# Patient Record
Sex: Male | Born: 1955 | Hispanic: No | Marital: Married
Health system: Southern US, Community
[De-identification: ages and names within clinical notes are randomized; demographics above are authoritative.]

---

## 2005-01-07 ENCOUNTER — Encounter: Admission: RE | Admit: 2005-01-07 | Discharge: 2005-01-07 | Payer: Self-pay | Admitting: Specialist

## 2005-01-08 ENCOUNTER — Ambulatory Visit (HOSPITAL_COMMUNITY): Admission: RE | Admit: 2005-01-08 | Discharge: 2005-01-09 | Payer: Self-pay | Admitting: Specialist

## 2005-05-22 ENCOUNTER — Emergency Department: Payer: Self-pay | Admitting: Internal Medicine

## 2005-05-22 ENCOUNTER — Other Ambulatory Visit: Payer: Self-pay

## 2005-10-23 ENCOUNTER — Inpatient Hospital Stay: Payer: Self-pay | Admitting: Internal Medicine

## 2006-05-31 ENCOUNTER — Ambulatory Visit: Payer: Self-pay | Admitting: Family Medicine

## 2007-01-03 ENCOUNTER — Inpatient Hospital Stay: Payer: Self-pay | Admitting: Internal Medicine

## 2007-01-03 ENCOUNTER — Other Ambulatory Visit: Payer: Self-pay

## 2007-02-21 ENCOUNTER — Emergency Department (HOSPITAL_COMMUNITY): Admission: EM | Admit: 2007-02-21 | Discharge: 2007-02-21 | Payer: Self-pay | Admitting: Emergency Medicine

## 2008-05-03 ENCOUNTER — Emergency Department: Payer: Self-pay | Admitting: Emergency Medicine

## 2008-11-16 ENCOUNTER — Inpatient Hospital Stay: Payer: Self-pay | Admitting: Internal Medicine

## 2008-11-23 ENCOUNTER — Ambulatory Visit: Payer: Self-pay | Admitting: Internal Medicine

## 2008-12-10 ENCOUNTER — Inpatient Hospital Stay: Payer: Self-pay | Admitting: Specialist

## 2009-08-07 ENCOUNTER — Ambulatory Visit: Payer: Self-pay

## 2010-10-31 NOTE — Op Note (Signed)
Leonard Wallace, Leonard Wallace                ACCOUNT NO.:  [id number]   MEDICAL RECORD NO.:  [id number]          PATIENT TYPE:  AMB   LOCATION:  DAY                          FACILITY:  [facility]   PHYSICIAN:  Jene Every, M.D.    DATE OF BIRTH:  1956/05/20   DATE OF PROCEDURE:  01/08/2005  DATE OF DISCHARGE:                                 OPERATIVE REPORT   PREOPERATIVE DIAGNOSES:  Spinal stenosis at L4-5.   POSTOPERATIVE DIAGNOSES:  Spinal stenosis at L4-5.   PROCEDURE:  1.  Insertion of X Stop device at the L4-5 interspace.  2.  Partial excision of posterior element of the spinous process.   ANESTHESIA:  General.   ASSISTANT:  Wende Neighbors, P.A.   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 Recently noted to have severe  stenosis at 4-5, multifactorial with facet arthropathy, old focal disk  bulging, ligamentum flavum, hypertrophy. The patient demonstrated neurogenic  claudication with radicular symptoms left greater than right with standing  and relieved with flexion such as leaning forward on a table sitting down  also sitting down and leaning forward. The patient had been refractory to  conservative treatment including epidural steroid injections, activity  modifications, weight reduction, the patient was fairly obese. We had the  options of standard central decompression which with his size and congenital  stenosis, we felt that he would require multiple procedures including repeat  decompression, fusion, etc. Given the added relief in flexion, the option  was the X Stop device which holds the interspinous processes open to prevent  ligamentum flavum and facet compression on extension. We discussed the risks  and benefits of that procedure including bleeding, infection, damage to  vascular structures, no change in symptoms, worsening of symptoms, need for  removal of device and the standard laminectomy  approach. We felt that the  patient for his ability to return to work, if the X Stop was successful in  helping his symptoms, this will be a favorable situation for him. He did  understand, however, that he very well may require removal and eventual  decompression.   TECHNIQUE:  The patient in supine position and after the induction of  adequate general anesthesia and 500 mg of IV vancomycin, he was placed prone  on the Wilson frame in slight flexion. All bony prominences well padded, he  was secured. He is a fairly large size. The lumbar region was prepped and  draped in the usual sterile fashion. I made an incision based upon needle  localization over the spinous process of 4 with some difficulty in  visualizing the posterior elements due to the patient's size. The  subcutaneous tissue was dissected, electrocautery was utilized to achieve  hemostasis. The dorsolumbar fascia identified and divided in line with the  skin incision, paraspinous muscle elevated from the lamina of 4 and 5.  McCullough retractor was placed under x-ray. We identified the spinous  process of 4 and 5 and the interspinous process space. I then used the X  Stop dilators to  dilate beneath the interspinous ligament. I then placed the  tensioning device and there was no tension with this patient and moderate  flexion at 10, slightly at 12 and we felt adequate tension at 14 and over  tensioning at 16. I therefore felt a 14 given his size and the spacing was  appropriate. Some small areas posterior on the element on the undersurface  of the spinous process and removed a small portion of the bone with a 3 mm  Kerrison. And a small area off the facet. The device was then inserted  without difficulty. The wing was then attached, this was placed beneath the  interspinous ligament and confirmed on x-ray to be satisfactory. The wing  was attached on the contralateral side, clamped for compression and the set  screw tightened  and ratcheted x2 which is recommended by the surgical  technique. Reviewed this on the AP and lateral plane to be satisfactory  placement. It was felt that if we did get some distraction of the  interspinous ligament that was appropriate. I then copiously irrigated the  wound. It was felt that this was appropriate. Removed the McCullough  retractors, electrocautery utilized to achieve strict hemostasis.  Dorsolumbar fascia reapproximated with #1 Vicryl and interrupted figure-of-  eight suture. After the paraspinous muscles were examined with no evidence  of active bleeding, the subcutaneous tissue was approximated in multiple  layers with 0 and 2-0 Vicryl simple sutures, the skin was reapproximated  with staples. I did place a Penrose drain and brought it out between the  staples, covered with Adaptic 4 x 4's and ABD's and a sterile dressing. We  examined it laterally after relieving some of the flexion, it appeared to be  satisfactory. We then placed him back on the hospital bed, extubated him  without difficulty and transported him to the recovery room in satisfactory  condition.   The patient tolerated the procedure well with no complications.       JB/MEDQ  D:  01/08/2005  T:  01/08/2005  Job:  [id number]

## 2011-02-17 ENCOUNTER — Encounter: Payer: Self-pay | Admitting: Family Medicine

## 2011-09-29 ENCOUNTER — Inpatient Hospital Stay: Payer: Self-pay | Admitting: Internal Medicine

## 2011-09-29 DIAGNOSIS — R55 Syncope and collapse: Secondary | ICD-10-CM

## 2011-09-29 LAB — COMPREHENSIVE METABOLIC PANEL
Alkaline Phosphatase: 73 U/L (ref 50–136)
Anion Gap: 10 (ref 7–16)
Bilirubin,Total: 0.8 mg/dL (ref 0.2–1.0)
Calcium, Total: 8.7 mg/dL (ref 8.5–10.1)
Chloride: 104 mmol/L (ref 98–107)
Co2: 29 mmol/L (ref 21–32)
EGFR (African American): >60
EGFR (Non-African Amer.): >60
Glucose: 120 mg/dL — ABNORMAL HIGH (ref 65–99)
Osmolality: 288 (ref 275–301)
Potassium: 4.2 mmol/L (ref 3.5–5.1)
SGOT(AST): 54 U/L — ABNORMAL HIGH (ref 15–37)
SGPT (ALT): 46 U/L
Total Protein: 7.4 g/dL (ref 6.4–8.2)

## 2011-09-29 LAB — CK TOTAL AND CKMB (NOT AT ARMC)
CK, Total: 468 U/L — ABNORMAL HIGH (ref 35–232)
CK, Total: 245 U/L — ABNORMAL HIGH (ref 35–232)
CK-MB: 7.3 ng/mL — ABNORMAL HIGH (ref 0.5–3.6)

## 2011-09-29 LAB — APTT: Activated PTT: 33.3 secs (ref 23.6–35.9)

## 2011-09-29 LAB — CBC
HCT: 34.3 % — ABNORMAL LOW (ref 40.0–52.0)
HGB: 11.2 g/dL — ABNORMAL LOW (ref 13.0–18.0)
MCH: 26.7 pg (ref 26.0–34.0)
MCV: 82 fL (ref 80–100)
RBC: 4.19 10*6/uL — ABNORMAL LOW (ref 4.40–5.90)

## 2011-09-29 LAB — TROPONIN I: Troponin-I: <0.02 ng/mL

## 2011-09-29 LAB — PROTIME-INR: INR: 1.6

## 2011-09-30 DIAGNOSIS — I059 Rheumatic mitral valve disease, unspecified: Secondary | ICD-10-CM

## 2011-09-30 LAB — LIPID PANEL
Cholesterol: 157 mg/dL (ref 0–200)
Ldl Cholesterol, Calc: 112 mg/dL — ABNORMAL HIGH (ref 0–100)
Triglycerides: 111 mg/dL (ref 0–200)

## 2011-09-30 LAB — DRUG SCREEN, URINE
Amphetamines, Ur Screen: NEGATIVE (ref ?–1000)
Cannabinoid 50 Ng, Ur ~~LOC~~: NEGATIVE (ref ?–50)
MDMA (Ecstasy)Ur Screen: NEGATIVE (ref ?–500)
Methadone, Ur Screen: NEGATIVE (ref ?–300)
Opiate, Ur Screen: POSITIVE (ref ?–300)
Phencyclidine (PCP) Ur S: NEGATIVE (ref ?–25)
Tricyclic, Ur Screen: NEGATIVE (ref ?–1000)

## 2011-09-30 LAB — CBC WITH DIFFERENTIAL/PLATELET
Basophil #: 0 10*3/uL (ref 0.0–0.1)
Eosinophil %: 4 %
HCT: 31.2 % — ABNORMAL LOW (ref 40.0–52.0)
Monocyte %: 5.7 %
RDW: 17.4 % — ABNORMAL HIGH (ref 11.5–14.5)

## 2011-09-30 LAB — CK TOTAL AND CKMB (NOT AT ARMC): CK, Total: 161 U/L (ref 35–232)

## 2011-09-30 LAB — BASIC METABOLIC PANEL
Anion Gap: 5 — ABNORMAL LOW (ref 7–16)
BUN: 17 mg/dL (ref 7–18)
Calcium, Total: 8.4 mg/dL — ABNORMAL LOW (ref 8.5–10.1)
Creatinine: 0.85 mg/dL (ref 0.60–1.30)
EGFR (Non-African Amer.): >60
Glucose: 88 mg/dL (ref 65–99)
Sodium: 142 mmol/L (ref 136–145)

## 2011-09-30 LAB — TROPONIN I: Troponin-I: <0.02 ng/mL

## 2011-09-30 LAB — MAGNESIUM: Magnesium: 1.4 mg/dL — ABNORMAL LOW

## 2011-10-01 LAB — PROTIME-INR
INR: 1.3
Prothrombin Time: 16.8 secs — ABNORMAL HIGH (ref 11.5–14.7)

## 2011-11-04 ENCOUNTER — Encounter (HOSPITAL_COMMUNITY): Payer: Self-pay | Admitting: Radiology

## 2011-11-04 ENCOUNTER — Emergency Department (HOSPITAL_COMMUNITY): Payer: PRIVATE HEALTH INSURANCE

## 2011-11-04 ENCOUNTER — Inpatient Hospital Stay (HOSPITAL_COMMUNITY): Payer: PRIVATE HEALTH INSURANCE

## 2011-11-04 ENCOUNTER — Inpatient Hospital Stay (HOSPITAL_COMMUNITY)
Admission: EM | Admit: 2011-11-04 | Discharge: 2011-11-14 | DRG: 082 | Disposition: E | Payer: PRIVATE HEALTH INSURANCE | Source: Ambulatory Visit | Attending: General Surgery | Admitting: General Surgery

## 2011-11-04 DIAGNOSIS — S0190XA Unspecified open wound of unspecified part of head, initial encounter: Secondary | ICD-10-CM

## 2011-11-04 DIAGNOSIS — J9383 Other pneumothorax: Secondary | ICD-10-CM | POA: Diagnosis not present

## 2011-11-04 DIAGNOSIS — E871 Hypo-osmolality and hyponatremia: Secondary | ICD-10-CM | POA: Diagnosis present

## 2011-11-04 DIAGNOSIS — W3400XA Accidental discharge from unspecified firearms or gun, initial encounter: Secondary | ICD-10-CM

## 2011-11-04 DIAGNOSIS — S0100XA Unspecified open wound of scalp, initial encounter: Secondary | ICD-10-CM

## 2011-11-04 DIAGNOSIS — R000 Tachycardia, unspecified: Secondary | ICD-10-CM | POA: Diagnosis not present

## 2011-11-04 DIAGNOSIS — I6200 Nontraumatic subdural hemorrhage, unspecified: Secondary | ICD-10-CM

## 2011-11-04 DIAGNOSIS — S069X9A Unspecified intracranial injury with loss of consciousness of unspecified duration, initial encounter: Secondary | ICD-10-CM

## 2011-11-04 DIAGNOSIS — D62 Acute posthemorrhagic anemia: Secondary | ICD-10-CM | POA: Diagnosis present

## 2011-11-04 DIAGNOSIS — J90 Pleural effusion, not elsewhere classified: Secondary | ICD-10-CM | POA: Diagnosis not present

## 2011-11-04 DIAGNOSIS — G936 Cerebral edema: Secondary | ICD-10-CM | POA: Diagnosis present

## 2011-11-04 DIAGNOSIS — R578 Other shock: Secondary | ICD-10-CM | POA: Diagnosis present

## 2011-11-04 DIAGNOSIS — S065X9A Traumatic subdural hemorrhage with loss of consciousness of unspecified duration, initial encounter: Secondary | ICD-10-CM

## 2011-11-04 DIAGNOSIS — Y92009 Unspecified place in unspecified non-institutional (private) residence as the place of occurrence of the external cause: Secondary | ICD-10-CM

## 2011-11-04 DIAGNOSIS — X72XXXA Intentional self-harm by handgun discharge, initial encounter: Secondary | ICD-10-CM | POA: Diagnosis present

## 2011-11-04 DIAGNOSIS — T794XXA Traumatic shock, initial encounter: Secondary | ICD-10-CM

## 2011-11-04 DIAGNOSIS — D689 Coagulation defect, unspecified: Secondary | ICD-10-CM | POA: Diagnosis not present

## 2011-11-04 DIAGNOSIS — J95821 Acute postprocedural respiratory failure: Secondary | ICD-10-CM

## 2011-11-04 DIAGNOSIS — S020XXB Fracture of vault of skull, initial encounter for open fracture: Principal | ICD-10-CM | POA: Diagnosis present

## 2011-11-04 DIAGNOSIS — Z515 Encounter for palliative care: Secondary | ICD-10-CM

## 2011-11-04 DIAGNOSIS — N179 Acute kidney failure, unspecified: Secondary | ICD-10-CM | POA: Diagnosis present

## 2011-11-04 DIAGNOSIS — Z66 Do not resuscitate: Secondary | ICD-10-CM | POA: Diagnosis not present

## 2011-11-04 DIAGNOSIS — E232 Diabetes insipidus: Secondary | ICD-10-CM | POA: Diagnosis not present

## 2011-11-04 DIAGNOSIS — J9600 Acute respiratory failure, unspecified whether with hypoxia or hypercapnia: Secondary | ICD-10-CM | POA: Diagnosis present

## 2011-11-04 DIAGNOSIS — G9382 Brain death: Secondary | ICD-10-CM | POA: Diagnosis not present

## 2011-11-04 LAB — BLOOD GAS, ARTERIAL
Acid-base deficit: 8.5 mmol/L — ABNORMAL HIGH (ref 0.0–2.0)
Drawn by: 347641
FIO2: 0.4 %
MECHVT: 450 mL
O2 Saturation: 99.1 %
Patient temperature: 98.6
RATE: 16 resp/min
pCO2 arterial: 34.1 mmHg — ABNORMAL LOW (ref 35.0–45.0)

## 2011-11-04 LAB — COMPREHENSIVE METABOLIC PANEL
ALT: 20 U/L (ref 0–53)
Albumin: 3.7 g/dL (ref 3.5–5.2)
Calcium: 9.2 mg/dL (ref 8.4–10.5)
GFR calc Af Amer: 55 mL/min — ABNORMAL LOW (ref >90)
Glucose, Bld: 185 mg/dL — ABNORMAL HIGH (ref 70–99)
Potassium: 3.4 mEq/L — ABNORMAL LOW (ref 3.5–5.1)
Sodium: 138 mEq/L (ref 135–145)
Total Protein: 7.3 g/dL (ref 6.0–8.3)

## 2011-11-04 LAB — CBC
HCT: 32 % — ABNORMAL LOW (ref 39.0–52.0)
HCT: 32.9 % — ABNORMAL LOW (ref 39.0–52.0)
Hemoglobin: 10.7 g/dL — ABNORMAL LOW (ref 13.0–17.0)
Hemoglobin: 11.1 g/dL — ABNORMAL LOW (ref 13.0–17.0)
MCH: 26.9 pg (ref 26.0–34.0)
MCHC: 33.4 g/dL (ref 30.0–36.0)
MCHC: 33.7 g/dL (ref 30.0–36.0)
MCV: 81.2 fL (ref 78.0–100.0)

## 2011-11-04 LAB — POCT I-STAT 3, ART BLOOD GAS (G3+)
Acid-base deficit: 4 mmol/L — ABNORMAL HIGH (ref 0.0–2.0)
Bicarbonate: 21.4 mEq/L (ref 20.0–24.0)
O2 Saturation: 100 %
pO2, Arterial: 431 mmHg — ABNORMAL HIGH (ref 80.0–100.0)

## 2011-11-04 LAB — URINALYSIS, MICROSCOPIC ONLY
Bilirubin Urine: NEGATIVE
Glucose, UA: 100 mg/dL — AB
Hgb urine dipstick: NEGATIVE
Protein, ur: NEGATIVE mg/dL
Specific Gravity, Urine: 1.014 (ref 1.005–1.030)

## 2011-11-04 LAB — MRSA PCR SCREENING: MRSA by PCR: NEGATIVE

## 2011-11-04 LAB — BASIC METABOLIC PANEL
BUN: 26 mg/dL — ABNORMAL HIGH (ref 6–23)
GFR calc non Af Amer: 65 mL/min — ABNORMAL LOW (ref >90)
Glucose, Bld: 299 mg/dL — ABNORMAL HIGH (ref 70–99)
Potassium: 3.5 mEq/L (ref 3.5–5.1)

## 2011-11-04 LAB — POCT I-STAT, CHEM 8
Creatinine, Ser: 1.6 mg/dL — ABNORMAL HIGH (ref 0.50–1.35)
Glucose, Bld: 190 mg/dL — ABNORMAL HIGH (ref 70–99)
Hemoglobin: 11.9 g/dL — ABNORMAL LOW (ref 13.0–17.0)
TCO2: 21 mmol/L (ref 0–100)

## 2011-11-04 LAB — CDS SEROLOGY

## 2011-11-04 LAB — LACTIC ACID, PLASMA: Lactic Acid, Venous: 3.5 mmol/L — ABNORMAL HIGH (ref 0.5–2.2)

## 2011-11-04 MED ORDER — METOPROLOL TARTRATE 1 MG/ML IV SOLN
10.0000 mg | Freq: Four times a day (QID) | INTRAVENOUS | Status: DC | PRN
  Administered 2011-11-04 (×2): 10 mg via INTRAVENOUS
  Filled 2011-11-04: qty 10

## 2011-11-04 MED ORDER — MANNITOL 25 % IV SOLN
25.0000 g | Freq: Once | INTRAVENOUS | Status: AC
  Administered 2011-11-04: 25 g via INTRAVENOUS

## 2011-11-04 MED ORDER — VITAMIN K1 10 MG/ML IJ SOLN
10.0000 mg | Freq: Once | INTRAVENOUS | Status: AC
  Administered 2011-11-04: 10 mg via INTRAVENOUS
  Filled 2011-11-04: qty 1

## 2011-11-04 MED ORDER — CEFAZOLIN SODIUM-DEXTROSE 2-3 GM-% IV SOLR
2.0000 g | Freq: Once | INTRAVENOUS | Status: AC
  Administered 2011-11-04: 2 g via INTRAVENOUS
  Filled 2011-11-04: qty 50

## 2011-11-04 MED ORDER — BIOTENE DRY MOUTH MT LIQD
15.0000 mL | Freq: Four times a day (QID) | OROMUCOSAL | Status: DC
  Administered 2011-11-04 – 2011-11-08 (×17): 15 mL via OROMUCOSAL

## 2011-11-04 MED ORDER — MANNITOL 25 % IV SOLN
INTRAVENOUS | Status: AC
  Administered 2011-11-04: 25 g via INTRAVENOUS
  Filled 2011-11-04: qty 100

## 2011-11-04 MED ORDER — METOPROLOL TARTRATE 1 MG/ML IV SOLN
INTRAVENOUS | Status: AC
  Filled 2011-11-04: qty 10

## 2011-11-04 MED ORDER — SODIUM CHLORIDE 0.9 % IV SOLN
2.0000 mg/h | INTRAVENOUS | Status: DC
  Administered 2011-11-04: 2 mg/h via INTRAVENOUS
  Filled 2011-11-04: qty 10

## 2011-11-04 MED ORDER — PANTOPRAZOLE SODIUM 40 MG PO TBEC: 40.0000 mg | DELAYED_RELEASE_TABLET | Freq: Every day | ORAL | Status: DC

## 2011-11-04 MED ORDER — FENTANYL BOLUS VIA INFUSION
50.0000 ug | Freq: Four times a day (QID) | INTRAVENOUS | Status: DC | PRN
  Filled 2011-11-04: qty 100

## 2011-11-04 MED ORDER — PANTOPRAZOLE SODIUM 40 MG IV SOLR
40.0000 mg | Freq: Every day | INTRAVENOUS | Status: DC
  Administered 2011-11-04 – 2011-11-07 (×4): 40 mg via INTRAVENOUS
  Filled 2011-11-04 (×5): qty 40

## 2011-11-04 MED ORDER — MIDAZOLAM BOLUS VIA INFUSION
1.0000 mg | INTRAVENOUS | Status: DC | PRN
  Filled 2011-11-04 (×2): qty 2

## 2011-11-04 MED ORDER — FENTANYL CITRATE 0.05 MG/ML IJ SOLN
50.0000 ug/h | INTRAMUSCULAR | Status: DC
  Administered 2011-11-04: 50 ug/h via INTRAVENOUS
  Filled 2011-11-04: qty 50

## 2011-11-04 MED ORDER — SODIUM CHLORIDE 0.9 % IV BOLUS (SEPSIS)
1000.0000 mL | INTRAVENOUS | Status: DC | PRN
  Administered 2011-11-04: 350 mL via INTRAVENOUS
  Administered 2011-11-04: 16:00:00 via INTRAVENOUS
  Administered 2011-11-04: 250 mL via INTRAVENOUS
  Administered 2011-11-06: 1000 mL via INTRAVENOUS

## 2011-11-04 MED ORDER — CHLORHEXIDINE GLUCONATE 0.12 % MT SOLN
15.0000 mL | Freq: Two times a day (BID) | OROMUCOSAL | Status: DC
  Administered 2011-11-04 – 2011-11-08 (×9): 15 mL via OROMUCOSAL
  Filled 2011-11-04 (×10): qty 15

## 2011-11-04 MED ORDER — ESMOLOL HCL-SODIUM CHLORIDE 2000 MG/100ML IV SOLN
25.0000 ug/kg/min | INTRAVENOUS | Status: DC
  Administered 2011-11-04: 150 ug/kg/min via INTRAVENOUS
  Administered 2011-11-04 (×2): 200 ug/kg/min via INTRAVENOUS
  Administered 2011-11-04: 250 ug/kg/min via INTRAVENOUS
  Administered 2011-11-04 (×2): 200 ug/kg/min via INTRAVENOUS
  Administered 2011-11-04: 300 ug/kg/min via INTRAVENOUS
  Administered 2011-11-04: 150 ug/kg/min via INTRAVENOUS
  Administered 2011-11-04 (×2): 200 ug/kg/min via INTRAVENOUS
  Administered 2011-11-04: 250 ug/kg/min via INTRAVENOUS
  Administered 2011-11-04 – 2011-11-05 (×2): 300 ug/kg/min via INTRAVENOUS
  Filled 2011-11-04 (×14): qty 100

## 2011-11-04 MED ORDER — ACETAMINOPHEN 160 MG/5ML PO SOLN: 650.0000 mg | Freq: Four times a day (QID) | ORAL | Status: DC | PRN

## 2011-11-04 MED ORDER — ESMOLOL HCL-SODIUM CHLORIDE 2000 MG/100ML IV SOLN
25.0000 ug/kg/min | INTRAVENOUS | Status: DC
  Administered 2011-11-04: 50 ug/kg/min via INTRAVENOUS
  Administered 2011-11-04: 200 ug/kg/min via INTRAVENOUS
  Filled 2011-11-04 (×3): qty 100

## 2011-11-04 MED ORDER — METOPROLOL TARTRATE 1 MG/ML IV SOLN: 10.0000 mg | Freq: Four times a day (QID) | INTRAVENOUS | Status: DC

## 2011-11-04 MED ORDER — DESMOPRESSIN ACETATE 4 MCG/ML IJ SOLN
2.0000 ug | Freq: Two times a day (BID) | INTRAMUSCULAR | Status: DC
  Administered 2011-11-04 (×2): 2 ug via INTRAVENOUS
  Filled 2011-11-04 (×5): qty 0.5

## 2011-11-04 MED ORDER — POTASSIUM CHLORIDE IN NACL 20-0.9 MEQ/L-% IV SOLN
INTRAVENOUS | Status: DC
  Administered 2011-11-04 – 2011-11-05 (×4): via INTRAVENOUS
  Filled 2011-11-04 (×8): qty 1000

## 2011-11-04 NOTE — ED Notes (Signed)
Off back board, Dr. Janee Morn to exam  No injuries found
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
## 2011-11-04 NOTE — ED Notes (Signed)
Per Dr. Janee Morn pupils 5mm and reactive

## 2011-11-04 NOTE — ED Notes (Signed)
###   CONTACT INFORMATION ###  WIFE: Markise Haymer [phone] cell [phone] friends cell

## 2011-11-04 NOTE — ED Notes (Signed)
Pt arrived to CT. 

## 2011-11-04 NOTE — Progress Notes (Signed)
Department of Spiritual Care Chaplain responded to trauma page to ED.  Patient was being assessed by medical staff and was unable to offer pastoral support at that time.  Will follow up as needed for support and refer to assigned Chaplain for andy additional pastoral needs On Call Chaplain Janell Quiet [phone]
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
## 2011-11-04 NOTE — Progress Notes (Signed)
UR complete 
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
## 2011-11-04 NOTE — ED Notes (Signed)
Back from CT

## 2011-11-04 NOTE — Progress Notes (Signed)
Asked to see this patient urgently because of non-reactive pupils when he  Had previously had a gag reflex and sluggishly reactive pupils.   Stat CT of the head ordered along with Mannitol 25gm IVP. Neurosurgeon contacted for his advice.  Marta Lamas. Gae Bon, MD, FACS [phone] Trauma Surgeon
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
## 2011-11-04 NOTE — Procedures (Signed)
Arterial Catheter Insertion Procedure Note Leonard Wallace [id number] 1956/01/28  Procedure: Insertion of Arterial Catheter  Indications: Blood pressure monitoring  Procedure Details Consent: Unable to obtain consent because of emergent medical necessity. Time Out: Verified patient identification, verified procedure, site/side was marked, verified correct patient position, special equipment/implants available, medications/allergies/relevent history reviewed, required imaging and test results available.  Performed  Maximum sterile technique was used including cap, gloves, gown, hand hygiene and mask. Skin prep: Chlorhexidine; local anesthetic administered 20 gauge catheter was inserted into right radial artery using the Seldinger technique.  Evaluation Blood flow good; BP tracing good. Complications: No apparent complications.   Leonard Wallace 10/21/2011

## 2011-11-04 NOTE — Progress Notes (Signed)
New vent check due to changes in rate, fio2, and vt per md

## 2011-11-05 LAB — PREPARE FRESH FROZEN PLASMA
Unit division: 0
Unit division: 0

## 2011-11-05 LAB — BLOOD GAS, ARTERIAL
Bicarbonate: 23.2 mEq/L (ref 20.0–24.0)
Drawn by: 320991
MECHVT: 450 mL
O2 Saturation: 98 %
PEEP: 5 cmH2O
Patient temperature: 98.6
RATE: 16 resp/min

## 2011-11-05 MED ORDER — SODIUM CHLORIDE 0.9 % IV BOLUS (SEPSIS)
500.0000 mL | Freq: Once | INTRAVENOUS | Status: AC
  Administered 2011-11-05: 500 mL via INTRAVENOUS

## 2011-11-05 MED ORDER — HETASTARCH-ELECTROLYTES 6 % IV SOLN
500.0000 mL | Freq: Once | INTRAVENOUS | Status: AC
  Administered 2011-11-05: 500 mL via INTRAVENOUS
  Filled 2011-11-05: qty 500

## 2011-11-05 MED ORDER — ALBUMIN HUMAN 5 % IV SOLN
25.0000 g | Freq: Once | INTRAVENOUS | Status: AC
  Administered 2011-11-05: 25 g via INTRAVENOUS

## 2011-11-05 MED ORDER — SODIUM CHLORIDE 0.9 % IV BOLUS (SEPSIS): 1000.0000 mL | Freq: Once | INTRAVENOUS | Status: DC

## 2011-11-05 MED ORDER — SODIUM CHLORIDE 0.9 % IV BOLUS (SEPSIS)
1000.0000 mL | Freq: Once | INTRAVENOUS | Status: AC
  Administered 2011-11-05: 1000 mL via INTRAVENOUS

## 2011-11-05 MED ORDER — HETASTARCH-ELECTROLYTES 6 % IV SOLN
500.0000 mL | Freq: Four times a day (QID) | INTRAVENOUS | Status: AC | PRN
  Administered 2011-11-05 – 2011-11-06 (×2): 500 mL via INTRAVENOUS
  Filled 2011-11-05 (×5): qty 500

## 2011-11-05 MED ORDER — ALBUMIN HUMAN 5 % IV SOLN
INTRAVENOUS | Status: AC
  Filled 2011-11-05: qty 500

## 2011-11-05 MED ORDER — NOREPINEPHRINE BITARTRATE 1 MG/ML IJ SOLN
2.0000 ug/min | INTRAVENOUS | Status: DC
  Administered 2011-11-05: 4 ug/min via INTRAVENOUS
  Administered 2011-11-06: 9 ug/min via INTRAVENOUS
  Administered 2011-11-06: 20 ug/min via INTRAVENOUS
  Administered 2011-11-07: 25 ug/min via INTRAVENOUS
  Filled 2011-11-05 (×6): qty 4
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
## 2011-11-05 NOTE — Clinical Social Work Note (Signed)
Clinical Social Worker escorted patient wife to visit with patient briefly and say her "goodbye's."  Patient wife was appropriately emotional and realistic regarding patient current medical status of progressing to brain death.  Patient wife is agreeable to organ donation once patient pronounced brain dead.  CSW will continue to follow patient wife for support.  [address] [place], [place] [phone]
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
## 2011-11-05 NOTE — Progress Notes (Signed)
Patient ID: Leonard Wallace, male   DOB: 1955/06/25, 56 y.o.   MRN: [id number] I met with the patient's wife and friend, along with our trauma PA, care manager, and social worker.  I updated her on the patient's current clinical situation and his liklehood to progress to brain death in the next 24-48 hours.  I explained the plan for brain flow study when his exam is consistent with brain death.  She asked about organ donation, knowing he has first person consent on his license.  I told her a representative from [facility] was going to speak to her.  We, as a team, answered her questions.  We also discussed code status and she stated clearly the patient, knowing his current situation, would not want to be resuscitated should his heart stop.  I will make him DNR. Violeta Gelinas, MD, MPH, FACS Pager: [phone]

## 2011-11-05 NOTE — Progress Notes (Signed)
Patient ID: Leonard Wallace, male   DOB: 06-25-1955, 56 y.o.   MRN: [id number] Patient is about the same. He is not showing as much respiration over the vent. He has no other neuro activity. Almost brain dead. Will see if he has any neuro function tomorrow, and if not, can get cerebral blood flow study.

## 2011-11-05 NOTE — Progress Notes (Signed)
Pt's blood pressure began to drop. Turned esmolol off completely and made Dr. Lindie Spruce aware. Received orders to give 500cc 5% albuminin and 500cc NS bolus.

## 2011-11-05 NOTE — Progress Notes (Signed)
Notified Dr. Lindie Spruce again about patient's low blood pressure. Recieved orders to start levophed.

## 2011-11-06 ENCOUNTER — Inpatient Hospital Stay (HOSPITAL_COMMUNITY): Payer: PRIVATE HEALTH INSURANCE

## 2011-11-06 DIAGNOSIS — G9382 Brain death: Secondary | ICD-10-CM

## 2011-11-06 LAB — BLOOD GAS, ARTERIAL
Bicarbonate: 23.2 mEq/L (ref 20.0–24.0)
PEEP: 5 cmH2O
Patient temperature: 98.6
TCO2: 24.6 mmol/L (ref 0–100)
pCO2 arterial: 47.2 mmHg — ABNORMAL HIGH (ref 35.0–45.0)
pH, Arterial: 7.311 — ABNORMAL LOW (ref 7.350–7.450)

## 2011-11-06 LAB — CBC
HCT: 26.2 % — ABNORMAL LOW (ref 39.0–52.0)
Hemoglobin: 6.8 g/dL — CL (ref 13.0–17.0)
MCH: 27.9 pg (ref 26.0–34.0)
MCHC: 31.3 g/dL (ref 30.0–36.0)
MCV: 87.3 fL (ref 78.0–100.0)
RDW: 18.3 % — ABNORMAL HIGH (ref 11.5–15.5)
WBC: 5.8 10*3/uL (ref 4.0–10.5)

## 2011-11-06 LAB — BASIC METABOLIC PANEL
Calcium: 8.4 mg/dL (ref 8.4–10.5)
GFR calc non Af Amer: 54 mL/min — ABNORMAL LOW (ref >90)
Glucose, Bld: 148 mg/dL — ABNORMAL HIGH (ref 70–99)
Sodium: 160 mEq/L — ABNORMAL HIGH (ref 135–145)

## 2011-11-06 MED ORDER — HETASTARCH-ELECTROLYTES 6 % IV SOLN
500.0000 mL | Freq: Once | INTRAVENOUS | Status: AC
  Administered 2011-11-06: 500 mL via INTRAVENOUS

## 2011-11-06 MED ORDER — KCL IN DEXTROSE-NACL 20-5-0.45 MEQ/L-%-% IV SOLN
INTRAVENOUS | Status: DC
  Administered 2011-11-06 – 2011-11-07 (×4): via INTRAVENOUS
  Filled 2011-11-06 (×7): qty 1000

## 2011-11-06 NOTE — Progress Notes (Signed)
Made Dr. Michaell Cowing aware of patients low urine output after getting 500cc of Hespan and 1 liter bolus of NS. Received orders to give another 500cc of Hespan. Also made him aware of patient's Hgb of 6.8. Only order received was to check Hgb Q8 hours for the next 24 hours.
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
## 2011-11-06 NOTE — Progress Notes (Signed)
Patient urine output down to 7 cc over last hour.  Dr. Lindie Spruce MD notified await further orders

## 2011-11-06 NOTE — Procedures (Signed)
Chest Tube Insertion Procedure Note  Indications:  Clinically significant Pneumothorax  Pre-operative Diagnosis: Pneumothorax  Post-operative Diagnosis: Pneumothorax and Hemothorax  Procedure Details  Informed consent was obtained for the procedure, including sedation.  Risks of lung perforation, hemorrhage, arrhythmia, and adverse drug reaction were discussed.   After sterile skin prep, using standard technique, a 32 French tube was placed in the left lateral 6th rib space.  Findings: 100 ml of serosanguinous fluid obtained and escape of air  Estimated Blood Loss:  less than 100 mL         Specimens:  None              Complications:  None; patient tolerated the procedure well.         Disposition: ICU - intubated and critically ill.         Condition: stable  Attending Attestation: I performed the procedure.

## 2011-11-06 NOTE — Procedures (Addendum)
Central Venous Catheter Insertion Procedure Note Leonard Wallace [id number] July 06, 1955  Procedure: Insertion of Central Venous Catheter Indications: Assessment of intravascular volume, Drug and/or fluid administration and Frequent blood sampling  Procedure Details Consent: Unable to obtain consent because of emergent medical necessity. Time Out: Verified patient identification, verified procedure, site/side was marked, verified correct patient position, special equipment/implants available, medications/allergies/relevent history reviewed, required imaging and test results available.  Performed  Maximum sterile technique was used including antiseptics, cap, gloves, gown, hand hygiene, mask and sheet. Skin prep: Chlorhexidine; local anesthetic administered  Multiple attempts made to cannulate the right subclavian vein, return blood flow on 3 separate occasions. In each case, unable to pass wire into vein. About 20 minutes into the procedure the patient became acutely hypoxic with saturations in the low 80's. The procedure was aborted and a stat portable chest x-ray was obtained.  Evaluation  CXR: normal.   Freeman Caldron, PA-C Pager: [phone] General Trauma PA Pager: [phone] 11/06/2011, 3:30 PM

## 2011-11-06 NOTE — Progress Notes (Signed)
CRITICAL VALUE ALERT  Critical value received:  Hgb 6.8  Date of notification:  11/06/11  Time of notification:  0444  Critical value read back:yes  Nurse who received alert:  Pete Glatter   MD notified (1st page):  Dr. Michaell Cowing  Time of first page: [phone]  MD notified (2nd page):  Time of second page:  Responding MD:  Dr. Michaell Cowing  Time MD responded:  Dr. Michaell Cowing

## 2011-11-06 NOTE — Procedures (Signed)
Central line could not be inserted.  Will attempt again in the near future.  Marta Lamas. Gae Bon, MD, FACS [phone] Trauma Surgeon

## 2011-11-06 NOTE — Progress Notes (Signed)
Dr. Michaell Cowing notified of contining low urine output after 1 liter bolus given to patient (16cc/hr).  Orders given for Hespan 500cc bolus per q6h for urine output <120 cc per q6h
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
## 2011-11-06 NOTE — Clinical Social Work Note (Signed)
Clinical Social Worker continuing to follow for emotional support.  CSW attempted to reach patient wife to check in - patient wife was clear when leaving the hospital yesterday that she would not return to patient bedside.  Per MD, patient continuing to progress to brain death.  Patient is a first consent organ donor and CDS and been informed of patient information/prognosis.  CSW will continue to follow up with patient wife to offer support and discuss plans for patient after he expires.  [address] [place], [place] [phone]

## 2011-11-07 LAB — CBC
HCT: 28.5 % — ABNORMAL LOW (ref 39.0–52.0)
Hemoglobin: 8.7 g/dL — ABNORMAL LOW (ref 13.0–17.0)
MCH: 27.1 pg (ref 26.0–34.0)
MCHC: 30.5 g/dL (ref 30.0–36.0)
MCV: 88.8 fL (ref 78.0–100.0)

## 2011-11-07 LAB — TYPE AND SCREEN
Unit division: 0
Unit division: 0

## 2011-11-07 LAB — DIFFERENTIAL
Basophils Relative: 0 % (ref 0–1)
Eosinophils Absolute: 0.5 10*3/uL (ref 0.0–0.7)
Eosinophils Relative: 6 % — ABNORMAL HIGH (ref 0–5)
Monocytes Absolute: 0.8 10*3/uL (ref 0.1–1.0)
Monocytes Relative: 10 % (ref 3–12)
Neutrophils Relative %: 72 % (ref 43–77)

## 2011-11-07 LAB — BASIC METABOLIC PANEL
BUN: 40 mg/dL — ABNORMAL HIGH (ref 6–23)
Calcium: 8.7 mg/dL (ref 8.4–10.5)
Creatinine, Ser: 2.53 mg/dL — ABNORMAL HIGH (ref 0.50–1.35)
GFR calc Af Amer: 31 mL/min — ABNORMAL LOW (ref >90)
GFR calc non Af Amer: 27 mL/min — ABNORMAL LOW (ref >90)

## 2011-11-07 LAB — PREPARE FRESH FROZEN PLASMA: Unit division: 0

## 2011-11-07 MED ORDER — SODIUM CHLORIDE 0.9 % IV BOLUS (SEPSIS)
500.0000 mL | Freq: Once | INTRAVENOUS | Status: AC
  Administered 2011-11-07: 500 mL via INTRAVENOUS

## 2011-11-07 MED ORDER — HETASTARCH-ELECTROLYTES 6 % IV SOLN
500.0000 mL | Freq: Once | INTRAVENOUS | Status: AC
  Administered 2011-11-07: 500 mL via INTRAVENOUS
  Filled 2011-11-07 (×2): qty 500

## 2011-11-07 MED ORDER — IPRATROPIUM-ALBUTEROL 18-103 MCG/ACT IN AERO
6.0000 | INHALATION_SPRAY | Freq: Four times a day (QID) | RESPIRATORY_TRACT | Status: DC | PRN
  Filled 2011-11-07: qty 14.7

## 2011-11-07 MED ORDER — NOREPINEPHRINE BITARTRATE 1 MG/ML IJ SOLN
2.0000 ug/min | INTRAVENOUS | Status: DC
  Administered 2011-11-07: 30 ug/min via INTRAVENOUS
  Administered 2011-11-07: 25 ug/min via INTRAVENOUS
  Administered 2011-11-08 (×2): 100 ug/min via INTRAVENOUS
  Filled 2011-11-07 (×4): qty 16

## 2011-11-07 MED ORDER — HETASTARCH-ELECTROLYTES 6 % IV SOLN
500.0000 mL | Freq: Once | INTRAVENOUS | Status: AC
  Administered 2011-11-07: 500 mL via INTRAVENOUS

## 2011-11-07 MED ORDER — IPRATROPIUM-ALBUTEROL 18-103 MCG/ACT IN AERO: 2.0000 | INHALATION_SPRAY | Freq: Four times a day (QID) | RESPIRATORY_TRACT | Status: DC | PRN
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
## 2011-11-08 ENCOUNTER — Inpatient Hospital Stay (HOSPITAL_COMMUNITY): Payer: PRIVATE HEALTH INSURANCE

## 2011-11-08 LAB — CBC
HCT: 27.9 % — ABNORMAL LOW (ref 39.0–52.0)
Hemoglobin: 8.4 g/dL — ABNORMAL LOW (ref 13.0–17.0)
MCH: 27.2 pg (ref 26.0–34.0)
MCHC: 30.1 g/dL (ref 30.0–36.0)
RBC: 3.09 MIL/uL — ABNORMAL LOW (ref 4.22–5.81)

## 2011-11-08 LAB — HEPATIC FUNCTION PANEL
Albumin: 1.6 g/dL — ABNORMAL LOW (ref 3.5–5.2)
Alkaline Phosphatase: 62 U/L (ref 39–117)
Indirect Bilirubin: 0.2 mg/dL — ABNORMAL LOW (ref 0.3–0.9)
Total Protein: 4 g/dL — ABNORMAL LOW (ref 6.0–8.3)

## 2011-11-08 LAB — BASIC METABOLIC PANEL
CO2: 21 mEq/L (ref 19–32)
Calcium: 8.6 mg/dL (ref 8.4–10.5)
GFR calc Af Amer: 24 mL/min — ABNORMAL LOW (ref >90)
GFR calc non Af Amer: 21 mL/min — ABNORMAL LOW (ref >90)
Sodium: 151 mEq/L — ABNORMAL HIGH (ref 135–145)

## 2011-11-08 MED ORDER — SODIUM CHLORIDE 0.9 % IV BOLUS (SEPSIS)
1000.0000 mL | Freq: Once | INTRAVENOUS | Status: AC
  Administered 2011-11-08: 1000 mL via INTRAVENOUS

## 2011-11-08 MED ORDER — PHENYLEPHRINE HCL 10 MG/ML IJ SOLN
30.0000 ug/min | INTRAVENOUS | Status: DC
  Administered 2011-11-08: 30 ug/min via INTRAVENOUS
  Filled 2011-11-08 (×2): qty 1

## 2011-11-08 MED ORDER — HETASTARCH-ELECTROLYTES 6 % IV SOLN
500.0000 mL | Freq: Once | INTRAVENOUS | Status: AC
  Administered 2011-11-08: 500 mL via INTRAVENOUS
  Filled 2011-11-08: qty 500
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
## 2011-11-14 NOTE — Progress Notes (Signed)
Patient ID: Leonard Wallace, male   DOB: 09/03/1955, 56 y.o.   MRN: [id number] CDS rep called me and they are unable to use any organs at this point due to progressive organ failure despite therapies.  Per RN communication with the wife, she wants to respect the patients wishes and at this time withdraw aggressive care.  This is reasonable as the patient has no chance of any meaningful neurologic recovery. Violeta Gelinas, MD, MPH, FACS Pager: [phone]
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
## 2011-11-14 DEATH — deceased
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
## 2013-02-24 IMAGING — DX DG CHEST 1V PORT
1 series · 1 of 1 positions shown · non-contrast
Comparison: 11/04/2011

CLINICAL DATA: Unsuccessful attempt and right central line
placement.  Worsening hypoxia.

PORTABLE CHEST - 1 VIEW

[AP]
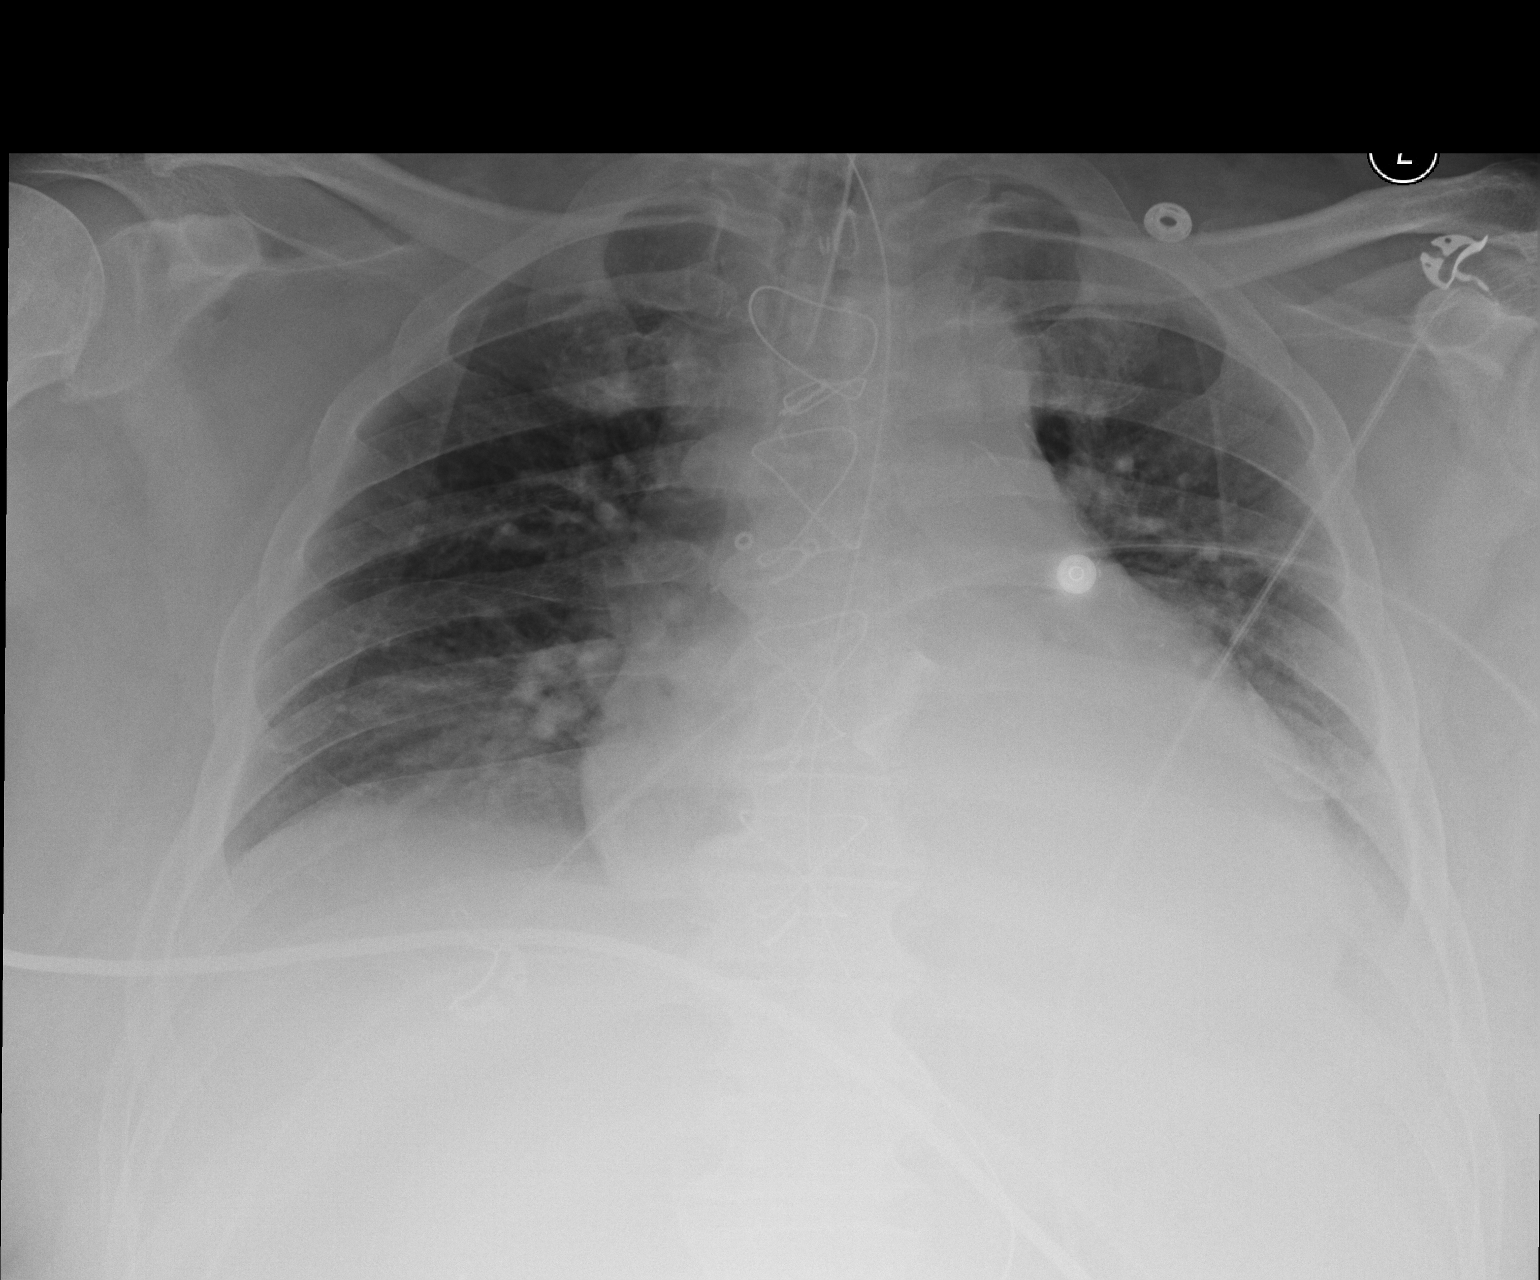

[1 of 1 positions shown; findings below may reference images not displayed]

FINDINGS: Endotracheal tube and nasogastric tube remain in place.
Low lung volumes are again demonstrated with increased bibasilar
atelectasis noted.  No evidence of pneumothorax.  Heart size is
stable.  Prior CABG and prosthetic heart valve again noted.
IMPRESSION: 1.  No evidence of pneumothorax.
2.  Low lung volumes with increased bibasilar atelectasis.

## 2014-10-07 NOTE — H&P (Signed)
PATIENT NAME:  Leonard Wallace, Leonard Wallace MR#:  [id number] DATE OF BIRTH:  1955/09/03  DATE OF ADMISSION:  09/29/2011  ADMITTING PHYSICIAN: Dr. Enid Baasadhika Ether Goebel.   PRIMARY CARE PHYSICIAN: Dr. Maryruth HancockSallie Patel.   CHIEF COMPLAINT: Unresponsiveness.   HISTORY OF PRESENT ILLNESS: Leonard Wallace is a 59 year old obese Caucasian male with past medical history significant for hypertension, diabetes mellitus, history of coronary artery disease status post bypass graft surgery in May 2012, aortic valve replacement with a mechanical valve at the same time and history of stroke after his bypass surgery with some residual right eye visual changes who was brought in today by EMS secondary to an unresponsive episode. The patient is currently minimally responsive only to verbal, tactile stimulus, and unable to provide any history. His wife is at bedside and provides most of the history.   According to the patient's wife he was at work today. 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 He was laid down and the EMS was called. He woke up when EMS was transporting him. He is currently minimally responsive, opening eyes and complaining of left-sided headache and also chest pressure. His first set of troponin is negative, but CK, CK-MB are elevated. According to wife, he has not been sick lately, but some time he is noncompliant with medications. His wife was recently hospitalized and just got discharged four days ago. While she was in the hospital he has been noncompliant with his medications. Also, he is on Lasix 40 mg daily, but over the weekend he was taking two to three pills of that every day, trying to get rid of the pedal edema.   PAST MEDICAL HISTORY:  1. Coronary artery disease status post bypass graft surgery.  2. [facility]ortic valve [facility] with [facility] valve [facility] 10/2010 [facility].  3. Diabetes mellitus.  4. Hypertension.  5. History of stroke after his bypass graft surgery.  6. Obesity. 7. Obstructive sleep apnea, noncompliant with CPAP.   PAST SURGICAL HISTORY:  1. Coronary artery bypass graft surgery.  2. Aortic valve replacement with mechanical valve on Coumadin.  3. Knee surgery.  4. Cholecystectomy.  5. Back surgery and has a titanium rod.   6. Bilateral carpal tunnel syndrome.   ALLERGIES: Penicillin.   MEDICATIONS:  1. Lasix 40 mg p.o. daily.  2. Celexa 40 mg p.o. daily. 3. Metformin 1,000 milligrams p.o. b.i.d.  4. Enalapril 10 mg p.o. daily.  5. AcipHex 20 mg p.o. daily.  6. Metoprolol 25 mg p.o. b.i.d.  7. Aspirin 81 mg p.o. daily.  8. Norvasc 10 mg p.o. daily.  9. Gabapentin 300 mg p.o. t.i.d. for back pain.  10. Trazodone 100 mg p.o. at bedtime.  11. Lantus 35 units subcutaneous at bedtime.  12. Coumadin 15 mg p.o. daily other than Tuesdays on which he takes 20 mg.   SOCIAL HISTORY: Lives at home with wife. Works, as mentioned above, as a Estate agentforklift operator. No history of any smoking. Remote history of heavy alcohol abuse, none currently, for more than 10 years.    FAMILY HISTORY: Mom with coronary artery disease and passed away in 5670s and dad had ruptured aneurysm and died in his 3740s.   REVIEW OF SYSTEMS: Difficult to be obtained secondary to the patient's mental status.   PHYSICAL EXAMINATION:  VITAL SIGNS: Temperature 97.5, pulse 60,  respirations 18, blood pressure 141/78, pulse oximetry was 98% on 2 liters oxygen.    GENERAL: Heavily built, well nourished male lying in bed, not in any acute distress.   HEENT: Normocephalic, atraumatic. Pupils: He is wearing contact lenses currently, but they are equal and round, reacting to light bilaterally. Anicteric sclerae. Extraocular movements intact. Oropharynx clear without erythema, mass or exudates. Tender to touch in the left temporal area.    NECK: Supple, short and thick neck. No  thyromegaly, JVD, or carotid bruits. No lymphadenopathy.   LUNGS: Moving air bilaterally. Decreased bibasilar breath sounds. No wheeze or crackles. No use of accessory muscles for breathing.   CARDIOVASCULAR: S1, S2, regular rate and rhythm. Has a loud systolic murmur in the aortic area probably from prosthetic valve.   ABDOMEN: Obese, soft, nontender, nondistended. No hepatosplenomegaly. Normal bowel sounds.   EXTREMITIES: He does have 1+ pitting edema. No clubbing or cyanosis. 2+ dorsalis pedis pulses palpable bilaterally.   SKIN: No acne, rash, or lesions.   LYMPHATICS: No cervical lymphadenopathy.   NEUROLOGIC: The patient is drowsy, easily arousable and follows simple commands, can answer direct questions, but his strength is globally weak with 3/5 strength of all upper and lower extremities. He does not have any facial drooping and his speech is clear.  PSYCHOLOGICAL: He is sleepy, easily arousable, oriented x2 so far.   LABORATORY, RADIOLOGICAL AND DIAGNOSTIC DATA: WBC 6.0, hemoglobin 11.2, hematocrit 34.3, platelet count 164. Sodium 142, potassium 4.2, chloride 104, bicarbonate 29, BUN 17, creatinine 1.03, glucose 120, calcium 8.7. ALT 46, AST 54, alkaline phosphatase 73, total bilirubin 0.8, albumin 3.4. CK 468, CK-MB 7.3. Troponin less than 0.02. CT of the head showing no acute intracranial abnormality. Bilateral thalamic lacunar infarcts are noted which does not appear acute, but they are not demonstrated previously. EKG is showing normal sinus rhythm, heart rate of 61. T wave depressions are seen in Lead I, aVL and possible old anterior infarct.    ASSESSMENT AND PLAN: A 59 year old male with past medical history significant for hypertension, diabetes, coronary artery disease with bypass graft surgery May 2012 at [facility], aortic valve replacement with a mechanical valve at the time and was brought in after he had a syncopal episode at work. According to the ER nursing reports and also per  the patient's wife, the patient has had chest pain and headache prior to syncope. Currently, he is minimally responsive and complaints of chest tightness and left-sided headache.  1. Syncope, either is cardiac cause or neurogenic cause. Will admit the patient to telemetry. He is currently protecting his airway. Will not need intubation right now. He is easily arousable, globally weak in all four extremities, so difficult to identify any focal neurological deficits. Will follow up cardiac enzymes, get MRI of the brain, echo, carotid Doppler's and also neuro checks. CT of the head is negative so far except for acute or chronic bilateral infarcts. He also has a mechanical valve and probably sometimes noncompliant with medications according to wife. So his INR is pending. If it is low, he will need bridging. Cardiology consultation.  2. Coronary artery disease status post bypass graft surgery with some chest tightness prior to syncope. Will get cardiology consult, echo Doppler and continue his medications of aspirin, metoprolol and enalapril. He is not on any statin. Followup lipid profile.  3. Hypertension. Continue home medications of metoprolol, enalapril, Norvasc and hold Lasix for now.  4. Diabetes mellitus. He is on metformin and Lantus. Continue sliding scale insulin  and hemoglobin A1c.  5. Aortic valve replacement in 10/2010 with mechanical valve on Coumadin followed by INR. If it is subtherapeutic, he will need bridging. Goal INR is 2.5 to 3.5. 6. Obstructive sleep apnea. Follow-up ABG and O2 support as needed. CPAP at night as tolerated.  7. Gastrointestinal and deep vein thrombosis prophylaxis. On Protonix and also Coumadin.  8. CODE STATUS: FULL CODE.   CRITICAL CARE TIME SPENT ON ADMISSION: 60 minutes.   ____________________________ Enid Baas, MD rk:ap D: 09/29/2011 12:37:04 ET T: 09/29/2011 13:44:47 ET JOB#: [id number] cc: Enid Baas, MD, <Dictator> Sarah "Sallie" Allena Katz,  MD Enid Baas MD ELECTRONICALLY SIGNED 09/29/2011 15:06

## 2014-10-07 NOTE — Consult Note (Signed)
PATIENT NAME:  Leonard Wallace, Leonard Wallace MR#:  [id number] DATE OF BIRTH:  March 18, 1956  DATE OF CONSULTATION:  09/30/2011  REFERRING PHYSICIAN:  Enid Baasadhika Kalisetti, MD  CONSULTING PHYSICIAN:  Rose PhiPeter R. Kemper Durielarke, MD  HISTORY: Mr. Leonard Wallace is a 59 year old right-handed married white patient of Dr. Maryruth HancockSallie Patel, a forklift operator with a history of hypertension, adult onset diabetes mellitus, remote heavy ethanol abuse, depression, obesity, untreated obstructive sleep apnea, acid reflux, degenerative disk disease status post remote back surgery with placement of a titanium rod, coronary artery disease status post 01/2011 CABG procedure, Coumadin treatment status post mechanical aortic valve replacement also performed May 2012, diplopia and mild right side weakness secondary to stroke at the time of May 2012 cardiac surgery, and history of admission to [facility] June 2010 for episodes of syncope. He was admitted 09/29/2011 and is referred for evaluation of his syncope. History comes from the patient, his hospital chart, his hospital records.   The patient was brought to the Emergency Room at 10:25 a.m. on 09/29/2011 by EMTs summoned by coworkers after a spell. 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 He had trouble using the computer display to shut down the forklift, coordination and vision was somewhat out of focus. He said he had a feeling of perhaps confusion. He reports that he felt some pressure of the left lateral aspect of the anterior chest, and he felt very lightheaded when standing in front of his coworker. A coworker pulled nearby a pull cart for the patient to sit where he reports he needed help to remain sitting up as he was leaning to his right side. He was next aware  that he was in the Emergency Room. His report is that EMTs were called because he passed out while he was  sitting on the pull cart and that he became awake en route with complaint of headache and chest pressure. On arrival in the Emergency Room, the nurse noted the patient to be unresponsive. Initial vital signs in the Emergency Room included blood pressure of 141/78, with a heart rate 80, respirations 18, oxygen saturation 98%.   The patient reports recent significant increase of stressors in the setting of two recent admissions to the hospital of his wife for renal failure and a recent diagnosis of cancer in his daughter.   PHYSICAL EXAMINATION: The patient is a well-developed and more than mildly overweight white gentleman examined lying semisupine with blood pressure 140/80 and  heart rate 64, and no fever. He was normocephalic without evidence of trauma. His neck was supple. He was anxious but not agitated and was mildly dysphoric, but without tearfulness. There was no evidence of thought disorder. He was lucid and a good historian. He was lucid and oriented with the exception that he was off on the exact date. He recalled three words immediately but then two of three at three minutes. His mini-mental state exam was otherwise normal. He was able to do calculations, correctly figured in his head the number of nickels in 85 cents, correctly figured in his head expected change for described grocery store  purchase.   On cranial nerve examination, there was vertical diplopia with gaze to the left, primarily looking upward to the left. With cover and uncover test of the eyes, this was not associated with relative weakness of the left eye. Cranial nerve  examination was otherwise symmetric and normal. Motor examination of the extremities showed normal tone and bulk throughout. There was good power proximally and distally in the left arm and leg. On the right, there was some early give way with motor power testing throughout. Muscles of the right side tested alone showed  very mild weakness of involving the arm and  leg. Extremity coordination testing was normal. His gait was not tested.   IMPRESSION:  1. History of stroke May 2012 with residual problems with vertical diplopia looking up to the left and described mild residual right-sided weakness.  2. History of depression.  3. History of recent increase of stressors.  4. History of episodes of syncope for which he was admitted to [facility] in 2010, but then left AGAINST MEDICAL ADVICE before his evaluation was completed.  5. Episode of symptoms including loss of consciousness with slow return to awake and alert state on 09/29/2011, of not entirely clear etiology. The patient has loss of time from when he had a pull cart at work until he was at the Emergency Room. I have low suspicion that this episode was secondary to seizure. He does not appear to have focal deficits to suggest that this episode included transient ischemic attack. Overall, I suspect that this may have been an anxiety or panic attack.   RECOMMENDATIONS: I agree with his present work-up in hospital including imaging and laboratory studies. I recommend that he have evaluation by Psychiatry with regard to mood problems, depression and anxiety, and problems with recent significant stressors. He is on Celexa 40 mg and has been on this dose for some years, per the patient. I do not see indication for lumbar puncture or indication for EEG study.       I appreciate being asked to see this pleasant and interesting gentleman.  ____________________________ Rose Phi. Kemper Durie, MD prc:cbb D: 10/01/2011 13:26:13 ET T: 10/01/2011 16:15:33 ET JOB#: [id number]  cc: Rose Phi. Kemper Durie, MD, <Dictator> Gaspar Garbe MD ELECTRONICALLY SIGNED 10/07/2011 11:06

## 2014-10-07 NOTE — Consult Note (Signed)
Brief Consult Note: Diagnosis: depression nos, anxiety do nos.   Patient was seen by consultant.   Consult note dictated.   Orders entered.   Discussed with Attending MD.   Comments: Psychiatry: PAtient seen. Consult dictated. Patient with multiple stresses and medical problems. Had an episode of falling out of unclear eitiology. Has history of anxiety and depression but denies any SI. I suggest adding buspar 5mg  tid for anxiety/SSRI "boosting". Patient agrees. Will print script. F/U with pcp. Supportive and educational tx done. No need for psych admit.  Electronic Signatures: Ky Moskowitz, Jackquline DenmarkJohn T (MD)  (Signed 18-Apr-13 17:31)  Authored: Brief Consult Note   Last Updated: 18-Apr-13 17:31 by Audery Amellapacs, Brunella Wileman T (MD)
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
## 2014-10-07 NOTE — Consult Note (Signed)
Brief Consult Note: Diagnosis: Syncope.   Patient was seen by consultant.   Consult note dictated.   Comments: [facility]. Subtherputic INR. Mild bradycardia.  Syncope with prolonged confusion. More likely to be neurologic than cardiac.  Agree with holding Metoprolol.  Echo is pending.  Recommend neurology consult.  Electronic Signatures: Lorine BearsArida, Muhammad (MD)  (Signed 16-Apr-13 18:49)  Authored: Brief Consult Note   Last Updated: 16-Apr-13 18:49 by Lorine BearsArida, Muhammad (MD)

## 2014-10-07 NOTE — Discharge Summary (Signed)
PATIENT NAME:  Leonard Wallace, Leonard Wallace MR#:  [id number] DATE OF BIRTH:  14-Apr-1956  DATE OF ADMISSION:  09/29/2011 DATE OF DISCHARGE:  10/01/2011  ADMITTING PHYSICIAN: Leonard Wallace, M.D.  DISCHARGING PHYSICIAN:  Leonard Wallace, M.D. PRIMARY CARE PHYSICIAN:  Leonard Wallace, M.D.  CONSULTATIONS IN THE HOSPITAL:  1. Cardiology consultation by Dr. Kirke Wallace. 2. Neurology consultation by Dr. Suzan SlickPeter Wallace.  3. Psychiatry consultation by Dr. Toni Wallace.    DISCHARGE DIAGNOSES:  1. Syncope, vasovagal versus psychogenic secondary to anxiety.  2. Left-sided headache, stress headache versus migraine. 3. Coronary artery disease status post bypass graft surgery. 4. 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 5. Anxiety and depression.  6. Hypertension.  7. Diabetes mellitus.  8. History of cerebrovascular accident associated with diplopia post bypass graft surgery.  9. Obstructive sleep apnea.   DISCHARGE MEDICATIONS:  1. Norvasc 10 mg p.o. daily.  2. Aspirin 81 mg p.o. daily.  3. Enalapril 10 mg  p.o. daily. 4. Gabapentin 300 mg p.o. t.i.d.  5. Lantus 35 units subcutaneously at bedtime. 6. Metformin 1000 mg p.o. b.i.d.  7. Klonopin 1 mg p.o. at bedtime.  8. Ammonium lactate topical lotion, apply topically. 9. Warfarin 15 mg p.o. daily except for 20 mg on Tuesdays.  10. Celexa 40 mg p.o. daily for depression. 11. Trazodone 50 mg, 200 mg at bedtime orally.  12. Lasix 40 mg p.o. daily.  13. AcipHex  20 mg p.o. daily.  14. Metoprolol 12.5 mg p.o. b.i.d. 15. Lovenox 140 mg subcutaneous b.i.d. for five days bridging while? INR is therapeutic.   DISCHARGE DIET: Low sodium, ADA diet.   DISCHARGE ACTIVITY: As tolerated.     FOLLOWUP INSTRUCTIONS:  1. Primary care physician followup in one to two weeks.  2. Follow up with Leonard Wallace in two weeks.  3. INR check in 2 to 3 days. Goal INR has to be between 2.5 and 3.5 at least before stopping  Lovenox.  LABS AND IMAGING STUDIES:  INR at the time of discharge was 1.3. Echo Doppler showed ejection fraction greater than 55%. Impaired left ventricle relaxation. Slightly elevated right-sided pressures and gradient consistent with prosthetic aortic valve. Urine tox screen was positive for opiates but he was getting morphine in the hospital.  WBC 5, hemoglobin 10.1, hematocrit 31.2, platelet count 133. Sodium 142, potassium 3.2, chloride 103, bicarb 34, BUN 17, creatinine 0.84, glucose 88, calcium 8.4. Magnesium was 1.4. Hemoglobin A1c 6.6. LDL 112, HDL 23, triglycerides 111, total cholesterol 157. Cardiac enzymes remained negative in the hospital. ANA and CRP were negative. CT of the head showing no acute intracranial abnormalities. Ultrasound carotid Dopplers bilaterally showing no hemodynamically significant stenosis. Chest x-ray showing bilateral diffuse interstitial thickening, likely interstitial pneumonitis versus edema.   BRIEF HOSPITAL COURSE: Leonard Wallace is a [age] year old obese Caucasian male with past medical history significant for coronary artery disease status post bypass graft surgery and aortic valve [facility] with [facility] following which he had a stroke with residual visual changes, hypertension, and diabetes who presents after he had a syncopal episode at work. He was initially admitted for possible cardiogenic versus neurogenic syncope.  1. Syncope:  Initially thought to be cardiogenic versus neurogenic. However, work-up seems to be negative for any cardiac causes or underlying neurological causes. MRI, echo Doppler's,  troponins all were negative. Less likely to be per seizure per neurologist Dr. Kemper Wallace. Possibilities could be related to anxiety or vasovagal syncope.  Slightly bradycardic on admission, but his heart rate has improved so he was put back on his metoprolol at a lower dose. He ambulated well but had persistent left-sided headache after  his the syncope.  2. Left-sided headache: Likely tension headache or migraine headache. Improved with narcotics and also muscle relaxants while in the hospital. Remote history of migraines but none recently, most probably secondary to anxiety and also stress. His headache has improved, not completely resolved, by the time of discharge. MRI has been negative.  3. Coronary artery disease status post bypass graft surgery: Echo stable. Appreciate cardiology consult. He was put back on aspirin, enalapril, and low dose metoprolol.  4. Hypertension: Home medications were continued.  5. Diabetes mellitus: He is on metformin and Lantus. Hemoglobin A1c acceptable at 6.6 so no changes were made. 6. Aortic valve replacement with mechanical valve:  He was on Coumadin. INR was 1.8 on admission secondary to some noncompliance issues.  He was put back on Lovenox and Coumadin while in the hospital and is goal INR needs to be between 2.5 and 3.5. He will be discharged on Lovenox for bridging and will be followed by Home Health. INR is scheduled 2 to 3 days after discharge.  7. His course has been otherwise uneventful in the hospital. He did work with physical therapy who recommended going home but he will be getting Home Health because of his multiple medical problems. His course has been otherwise uneventful in the hospital.   DISCHARGE CONDITION: Stable.   DISCHARGE DISPOSITION: Home with Home Health.   TIME SPENT ON DISCHARGE:  45 minutes.   ____________________________ Leonard Baas, MD [id number] D: 10/02/2011 14:31:54 ET T: 10/03/2011 15:58:17 ET JOB#: [id number]  cc: Leonard Baas, MD, <Dictator> [facility] Leonard Baas MD ELECTRONICALLY SIGNED 10/08/2011 2:52

## 2014-10-07 NOTE — Consult Note (Signed)
PATIENT NAME:  Noralee SpaceROBERTS, Rhone L MR#:  [id number] DATE OF BIRTH:  January 03, 1956  DATE OF CONSULTATION:  09/29/2011  REFERRING PHYSICIAN:  Dr. Nemiah CommanderKalisetti  CONSULTING PHYSICIAN:  Jerolyn CenterMuhammad A. Kirke CorinArida, MD  PRIMARY CARE PHYSICIAN: Maryruth HancockSallie Patel, MD   REASON FOR CONSULTATION: Syncope.   HISTORY OF PRESENT ILLNESS: This is a 59 year old Caucasian male with past medical history of aortic valve disease and coronary artery disease status post CABG and aortic valve replacement with a mechanical valve in May 2012, hypertension, type II diabetes, and postoperative stroke that happened after his aortic valve surgery. That left him with residual right-sided weakness and vision disturbances. The patient was in his usual health. He was at work today. He works as a Estate agentforklift operator. After he came down and was standing on the ground he did not feel well and all of a sudden he passed out according to the wife. EMS were called and he was minimally responsive. He complained of left-sided headache and some mild chest discomfort. The patient continued to be confused for a few hours after the event. He is currently waking up. He was noted to have mild bradycardia but currently his heart rate is in the early 60's. He is still complaining of mild chest discomfort and headache. He does not know exactly what happened. He never had a syncopal episode or a seizure disorder in the past. He said he has been retaining fluid recently and took extra Lasix over the weekend. He is on long-term anticoagulation with warfarin and usually requires high doses of warfarin. His INR was subtherapeutic at 1.6. CT head showed no evidence of intracranial bleed.   PAST MEDICAL HISTORY:  1. Coronary artery disease status post coronary artery bypass graft surgery.  2. [facility].  3. Type II diabetes.  4. Hypertension.  5. History of stroke after his valve and bypass surgery.  6. Obesity.   7. Obstructive sleep apnea.   ALLERGIES: Penicillin.    HOME MEDICATIONS:  1. Lasix 40 mg once daily.  2. Celexa 40 mg daily.  3. Metformin 1000 mg twice daily.  4. Enalapril 10 mg daily.  5. AcipHex 20 mg once daily.  6. Metoprolol 25 mg twice daily.  7. Aspirin 81 mg once a day.  8. Amlodipine 10 mg daily.  9. Gabapentin 300 mg t.i.d.  10. Trazodone 10 mg at bedtime.  11. Insulin Lantus 35 units at bedtime.  12. Coumadin 15 mg daily other than Tuesdays when he takes 20 mg.   SOCIAL HISTORY: He lives at home with his wife. He denies tobacco use. There is remote history of alcohol abuse.   FAMILY HISTORY: There is no family history of premature coronary artery disease. His mother had coronary artery disease in her 10670's.   REVIEW OF SYSTEMS: Difficult to obtain at this time. The patient is still slightly lethargic.   PHYSICAL EXAMINATION:   GENERAL: The patient appears to be at his stated age and in no acute distress. He is obese.   VITAL SIGNS: Temperature 98.0, pulse 57, respiratory rate 18, blood pressure 122/74, oxygen saturation is 98% on room air.   HEENT: Normocephalic, atraumatic.   NECK: No JVD or carotid bruits.   RESPIRATORY: Normal respiratory effort with no use of accessory muscles. Auscultation reveals normal breath sounds.   CARDIOVASCULAR: Normal PMI. Normal S1 and S2 with no gallops or murmurs. There are normal mechanical heart sounds.   ABDOMEN: Benign, nontender,  nondistended.   EXTREMITIES: Trace edema bilaterally.   SKIN: Warm and dry with no rash.   PSYCHIATRIC: The patient currently seems to be responding appropriately to questions. He seems to be oriented to time and place at this time.   LABORATORY AND DIAGNOSTIC DATA: ECG showed normal sinus rhythm with nonspecific T wave changes. There is borderline QT prolongation. Telemetry was reviewed which showed mild sinus bradycardia.   His labs showed a BUN of 17, creatinine 1.03. CPK was mildly  elevated at 467. Troponin was less than 0.02. Hemoglobin was 11.2 with a platelet count of 164. INR was 1.6. CT head showed no evidence of intracranial bleed.   IMPRESSION:  1. Syncope of undetermined etiology.  2. History of coronary artery disease and aortic valve disease, status post aortic valve replacement with mechanical valve as well as coronary artery bypass graft surgery in May of 2012.  3. Subtherapeutic anticoagulation.  4. Mild bradycardia.  5. Previous history of postoperative stroke.   RECOMMENDATIONS: The patient had a syncopal episode at work with prolonged confusion and lethargy after that. It does not appear that he was hypoglycemic. His glucose on presentation was 120. There is no evidence of arrhythmia. The prolonged confusion after the episode in spite of having normal vital signs argues against cardiac etiology. The event is more likely to be neurologic than cardiac in nature. Possible seizure disorder or new CVA should be entertained. His INR was subtherapeutic at 1.6, however, there are no clinical signs of valve thrombosis. Hemodynamically he appears to be stable. I do recommend an echocardiogram for further evaluation. He did have mild bradycardia but not enough to explain the event. I agree with holding metoprolol.     Further cardiac recommendations will follow after the echocardiogram. I recommend Neurology consultation.   ____________________________ Chelsea Aus Kirke Corin, MD [id number] D: 09/29/2011 18:59:52 ET T: 09/30/2011 09:56:05 ET JOB#: [id number]  cc: Jerolyn Center A. Kirke Corin, MD, <Dictator> Sarah "Maryruth Hancock, MD Iran Ouch MD ELECTRONICALLY SIGNED 10/03/2011 14:12
